# Patient Record
Sex: Female | Born: 1995 | Race: White | Hispanic: No | Marital: Single | Smoking: Never smoker
Health system: Southern US, Community
[De-identification: ages and names within clinical notes are randomized; demographics above are authoritative.]

## PROBLEM LIST (undated history)

## (undated) DIAGNOSIS — G90A Postural orthostatic tachycardia syndrome (POTS): Secondary | ICD-10-CM

## (undated) HISTORY — PX: KNEE SURGERY: SHX244

## (undated) HISTORY — PX: CHOLECYSTECTOMY: SHX55

---
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
## 2022-07-17 ENCOUNTER — Ambulatory Visit (HOSPITAL_COMMUNITY)
Admission: EM | Admit: 2022-07-17 | Discharge: 2022-07-17 | Disposition: A | Discharge status: Home / Self Care | Payer: Medicaid Other | Attending: Family | Admitting: Family

## 2022-07-17 ENCOUNTER — Encounter (HOSPITAL_COMMUNITY): Payer: Self-pay | Admitting: Psychiatry

## 2022-07-17 DIAGNOSIS — F319 Bipolar disorder, unspecified: Secondary | ICD-10-CM | POA: Insufficient documentation

## 2022-07-17 DIAGNOSIS — Z9152 Personal history of nonsuicidal self-harm: Secondary | ICD-10-CM | POA: Insufficient documentation

## 2022-07-17 DIAGNOSIS — Z789 Other specified health status: Secondary | ICD-10-CM | POA: Insufficient documentation

## 2022-07-17 DIAGNOSIS — F209 Schizophrenia, unspecified: Secondary | ICD-10-CM | POA: Insufficient documentation

## 2022-07-17 DIAGNOSIS — Z79899 Other long term (current) drug therapy: Secondary | ICD-10-CM | POA: Insufficient documentation

## 2022-07-17 NOTE — ED Provider Notes (Signed)
Behavioral Health Urgent Care Medical Screening Exam  Patient Name: Carol Simpson MRN: [id number] Date of Evaluation: 07/17/22 Chief Complaint:   Diagnosis:  Final diagnoses:  Difficulty refilling prescriptions    History of Present illness: Carol Simpson is a 27 y.o. female. Patient presents voluntarily to [facility] behavioral health for walk-in assessment.  Patient is accompanied by boyfriend who does not remain present during assessment.  Patient is assessed, face-to-face, by nurse practitioner. She is seated in assessment area, no acute distress. Consulted with provider, Dr. Leverne Humbles, and chart reviewed on 07/17/2022. She  is alert and oriented, pleasant and cooperative during assessment. Patient  presents with euthymic mood, congruent affect.   Carol Simpson seeking outpatient resources for mental health medication management and individual therapy providers today.  Patient reports history of bipolar disorder and schizophrenia.  Patient discharged from outpatient providers office, Dr. Jonny Ruiz, [facility] in [facility], 3 months ago.  Patient reports discharged because her mother, who attended same outpatient practice, was also discharged.  Per patient report both she and her mother were discharged from provider care.  Patient misused quetiapine by taking more than prescribed.  Patient is not linked with outpatient therapy.  Most recently saw counselor 3 years ago.  She would like to follow-up with outpatient counseling moving forward.  She endorses history of 1 previous inpatient psychiatric hospitalization 1 year ago.  She endorses history of nonsuicidal self-harm behavior by cutting from ages 11 to 64.  Most recent cutting episode 10 years ago.  Regarding history of suicide attempts she states "I do not know."  Patient reports she often took Seroquel "until I went to sleep."  Patient reports she would prefer not to be prescribed Seroquel moving forward.  No family mental health history  reported.  Carol Simpson indicates she has been stable on a combination of medications including alprazolam, trazodone, Vraylar and lithium.  Per her report, last prescription for these medications was approximately 3 months ago.  She would like to have these medications refilled.  Per PDMP, alprazolam refilled on 07/12/2022.  Patient reports she does not sleep well without alprazolam and trazodone.  Patient  denies suicidal and homicidal ideations.  Patient easily  contracts verbally for safety with this Probation officer. Patient has normal speech and behavior.  She  denies auditory and visual hallucinations.  Patient is able to converse coherently with goal-directed thoughts and no distractibility or preoccupation.  Denies symptoms of paranoia.  Objectively there is no evidence of psychosis/mania or delusional thinking.  Carol Simpson resides in [place] with her mother and her boyfriend.  She denies access to weapons.  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  Patient endorses average appetite.  She denies alcohol and substance use.  Patient offered support and encouragement.   Patient and family are educated and verbalize understanding of mental health resources and other crisis services in the community. They are instructed to call 911 and present to the nearest emergency room should patient experience any suicidal/homicidal ideation, auditory/visual/hallucinations, or detrimental worsening of mental health condition.      [facility] ED from 07/17/2022 in [place] No Risk       Psychiatric Specialty Exam  Presentation  General Appearance:Appropriate for Environment; Casual  Eye Contact:Good  Speech:Clear and Coherent; Normal Rate  Speech Volume:Normal  Handedness:Right   Mood and Affect  Mood: Euthymic  Affect: Appropriate; Congruent   Thought Process  Thought Processes: Coherent; Goal  Directed;  Linear  Descriptions of Associations:Intact  Orientation:Full (Time, Place and Person)  Thought Content:Logical; WDL    Hallucinations:None  Ideas of Reference:None  Suicidal Thoughts:No  Homicidal Thoughts:No   Sensorium  Memory: Immediate Good; Recent Good  Judgment: Fair  Insight: Fair   Executive Functions  Concentration: Good  Attention Span: Good  Recall: Good  Fund of Knowledge: Fair  Language: Fair   Psychomotor Activity  Psychomotor Activity: Normal   Assets  Assets: Communication Skills; Desire for Improvement; Financial Resources/Insurance; Housing; Leisure Time; Physical Health; Resilience; Social Support   Sleep  Sleep: Poor  Number of hours: No data recorded  Physical Exam: Physical Exam Vitals and nursing note reviewed.  Constitutional:      Appearance: Normal appearance. She is well-developed.  HENT:     Head: Normocephalic and atraumatic.     Nose: Nose normal.  Cardiovascular:     Rate and Rhythm: Normal rate.  Pulmonary:     Effort: Pulmonary effort is normal.  Musculoskeletal:        General: Normal range of motion.     Cervical back: Normal range of motion.  Skin:    General: Skin is warm and dry.  Neurological:     Mental Status: She is alert and oriented to person, place, and time.  Psychiatric:        Attention and Perception: Attention and perception normal.        Mood and Affect: Mood and affect normal.        Speech: Speech normal.        Behavior: Behavior normal. Behavior is cooperative.        Thought Content: Thought content normal.        Cognition and Memory: Cognition normal.    Review of Systems  Constitutional: Negative.   HENT: Negative.    Eyes: Negative.   Respiratory: Negative.    Cardiovascular: Negative.   Gastrointestinal: Negative.   Genitourinary: Negative.   Musculoskeletal: Negative.   Skin: Negative.   Neurological: Negative.   Psychiatric/Behavioral:  The  patient has insomnia.    Blood pressure 127/81, pulse 93, temperature 98.3 F (36.8 C), temperature source Oral, resp. rate 18, SpO2 97 %. There is no height or weight on file to calculate BMI.  Musculoskeletal: Strength & Muscle Tone: within normal limits Gait & Station: normal Patient leans: N/A   [place] M[facility] Discharge Disposition for Follow up and Recommendations: Based on my evaluation the patient does not appear to have an emergency medical condition and can be discharged with resources and follow up care in outpatient services for Medication Management and Individual Therapy Follow up with outpatient psychiatry, resources provided.  Reviewed outpatient providers to a walk-in availabilities including [facility] behavioral health and [place].  Lucky Rathke, FNP 07/17/2022, 11:06 AM

## 2022-07-17 NOTE — ED Triage Notes (Signed)
Pt presents to [facility] voluntarily accompanied by her boyfriend seeking a medication refill. Pt states she has been without her medication for 3 months after being dropped from her current psychiatrist. Pt reports being prescribed Lithium,viibryd, seroquel, Xanax, Vraylar, and trazodone.Pt denies SI/HI and AVH at this time.

## 2022-07-17 NOTE — Discharge Instructions (Addendum)
Patient is instructed prior to discharge to:  Take all medications as prescribed by his/her mental healthcare provider. Report any adverse effects and or reactions from the medicines to his/her outpatient provider promptly. Keep all scheduled appointments, to ensure that you are getting refills on time and to avoid any interruption in your medication.  If you are unable to keep an appointment call to reschedule.  Be sure to follow-up with resources and follow-up appointments provided.  Patient has been instructed & cautioned: To not engage in alcohol and or illegal drug use while on prescription medicines. In the event of worsening symptoms, patient is instructed to call the crisis hotline, 911 and or go to the nearest ED for appropriate evaluation and treatment of symptoms. To follow-up with his/her primary care provider for your other medical issues, concerns and or health care needs.  Information: -National Suicide Prevention Lifeline 1-800-SUICIDE or 934-709-0276.  -988 offers 24/7 access to trained crisis counselors who can help people experiencing mental health-related distress. People can call or text 988 or chat 988lifeline.org for themselves or if they are worried about a loved one who may need crisis support.     Outpatient Services for Therapy and Medication Management for [facility] [place], [place], [address] [phone] phone  New Patient Assessment/Therapy Walk-ins Monday and Wednesday: 8am until slots are full. Every 1st and 2nd Friday: 1pm - 5pm  NO ASSESSMENT/THERAPY WALK-INS ON [place]  New Patient Psychiatry/Medication Management Walk-ins Monday-Friday: 8am-11am  For all walk-ins, we ask that you arrive by 7:30am because patient will be seen in the order of arrival.  Availability is limited; therefore, you may not be seen on the same day that you walk-in.  Our goal is to serve and meet the needs of our  community to the best of our ability.   [facility] [address] [place], [place], [address] [phone] phone  [address] [place], [place], [address] [phone] phone ([address] [place], [place], [place], [place], [employer], [employer], [place], [place], [place], [place], [place], [employer], [employer], [employer], [place])  [facility] [address], [place], [place], [address] [phone] phone  [facility] [address], [place], [place], [address] [phone] phone  [facility] [address], [place], [place], [address] [phone] phone  [facility] [place] [address], [place], [address] [phone] phone  [facility], [place] [address], [place], [address] [phone] phone  [facility] [address] [place], [place], [address] [phone] phone [phone] fax  [facility] [address] [place], [place], [address] [phone] phone [phone] fax  [facility] [phone] [place], [place], [address] [phone] phone  [facility] [address] [place], [place], [address]  [phone] phone  [facility]  (Adults Only) [address] [place], [place], [address]  [phone] phone [phone] fax

## 2022-08-04 ENCOUNTER — Emergency Department (HOSPITAL_BASED_OUTPATIENT_CLINIC_OR_DEPARTMENT_OTHER): Payer: Medicaid Other

## 2022-08-04 ENCOUNTER — Other Ambulatory Visit: Payer: Self-pay

## 2022-08-04 ENCOUNTER — Emergency Department (HOSPITAL_BASED_OUTPATIENT_CLINIC_OR_DEPARTMENT_OTHER)
Admission: EM | Admit: 2022-08-04 | Discharge: 2022-08-04 | Disposition: A | Discharge status: Home / Self Care | Payer: Medicaid Other | Attending: Emergency Medicine | Admitting: Emergency Medicine

## 2022-08-04 ENCOUNTER — Encounter (HOSPITAL_BASED_OUTPATIENT_CLINIC_OR_DEPARTMENT_OTHER): Payer: Self-pay | Admitting: Emergency Medicine

## 2022-08-04 DIAGNOSIS — R1011 Right upper quadrant pain: Secondary | ICD-10-CM | POA: Diagnosis not present

## 2022-08-04 DIAGNOSIS — Z1152 Encounter for screening for COVID-19: Secondary | ICD-10-CM | POA: Diagnosis not present

## 2022-08-04 DIAGNOSIS — J069 Acute upper respiratory infection, unspecified: Secondary | ICD-10-CM | POA: Diagnosis not present

## 2022-08-04 DIAGNOSIS — R1010 Upper abdominal pain, unspecified: Secondary | ICD-10-CM

## 2022-08-04 DIAGNOSIS — R112 Nausea with vomiting, unspecified: Secondary | ICD-10-CM | POA: Diagnosis present

## 2022-08-04 HISTORY — DX: Postural orthostatic tachycardia syndrome (POTS): G90.A

## 2022-08-04 LAB — CBC
HCT: 42 % (ref 36.0–46.0)
Hemoglobin: 14.1 g/dL (ref 12.0–15.0)
MCH: 32.4 pg (ref 26.0–34.0)
MCHC: 33.6 g/dL (ref 30.0–36.0)
MCV: 96.6 fL (ref 80.0–100.0)
Platelets: 371 10*3/uL (ref 150–400)
RBC: 4.35 MIL/uL (ref 3.87–5.11)
RDW: 12.8 % (ref 11.5–15.5)
WBC: 11.5 10*3/uL — ABNORMAL HIGH (ref 4.0–10.5)
nRBC: 0 % (ref 0.0–0.2)

## 2022-08-04 LAB — COMPREHENSIVE METABOLIC PANEL
ALT: 33 U/L (ref 0–44)
AST: 28 U/L (ref 15–41)
Albumin: 4.6 g/dL (ref 3.5–5.0)
Alkaline Phosphatase: 83 U/L (ref 38–126)
Anion gap: 10 (ref 5–15)
BUN: 6 mg/dL (ref 6–20)
CO2: 17 mmol/L — ABNORMAL LOW (ref 22–32)
Calcium: 9.6 mg/dL (ref 8.9–10.3)
Chloride: 107 mmol/L (ref 98–111)
Creatinine, Ser: 0.97 mg/dL (ref 0.44–1.00)
GFR, Estimated: >60 mL/min (ref >60)
Glucose, Bld: 104 mg/dL — ABNORMAL HIGH (ref 70–99)
Potassium: 4 mmol/L (ref 3.5–5.1)
Sodium: 134 mmol/L — ABNORMAL LOW (ref 135–145)
Total Bilirubin: 0.7 mg/dL (ref 0.3–1.2)
Total Protein: 7.8 g/dL (ref 6.5–8.1)

## 2022-08-04 LAB — RESP PANEL BY RT-PCR (RSV, FLU A&B, COVID)  RVPGX2
Influenza A by PCR: NEGATIVE
Influenza B by PCR: NEGATIVE
Resp Syncytial Virus by PCR: NEGATIVE
SARS Coronavirus 2 by RT PCR: NEGATIVE

## 2022-08-04 LAB — URINALYSIS, ROUTINE W REFLEX MICROSCOPIC
Bilirubin Urine: NEGATIVE
Glucose, UA: NEGATIVE mg/dL
Ketones, ur: NEGATIVE mg/dL
Nitrite: NEGATIVE
Protein, ur: 30 mg/dL — AB
Specific Gravity, Urine: 1.025 (ref 1.005–1.030)
pH: 6 (ref 5.0–8.0)

## 2022-08-04 LAB — URINALYSIS, MICROSCOPIC (REFLEX)

## 2022-08-04 LAB — LIPASE, BLOOD: Lipase: 37 U/L (ref 11–51)

## 2022-08-04 LAB — PREGNANCY, URINE: Preg Test, Ur: NEGATIVE

## 2022-08-04 MED ORDER — FLUCONAZOLE 150 MG PO TABS
150.0000 mg | ORAL_TABLET | Freq: Once | ORAL | Status: AC
  Administered 2022-08-04: 150 mg via ORAL
  Filled 2022-08-04: qty 1

## 2022-08-04 MED ORDER — SODIUM CHLORIDE 0.9 % IV BOLUS
1000.0000 mL | Freq: Once | INTRAVENOUS | Status: AC
  Administered 2022-08-04: 1000 mL via INTRAVENOUS

## 2022-08-04 MED ORDER — ALPRAZOLAM 0.5 MG PO TABS
0.5000 mg | ORAL_TABLET | Freq: Once | ORAL | Status: AC
  Administered 2022-08-04: 0.5 mg via ORAL
  Filled 2022-08-04: qty 1

## 2022-08-04 MED ORDER — IOHEXOL 300 MG/ML  SOLN
100.0000 mL | Freq: Once | INTRAMUSCULAR | Status: AC | PRN
  Administered 2022-08-04: 100 mL via INTRAVENOUS

## 2022-08-04 MED ORDER — ONDANSETRON HCL 4 MG/2ML IJ SOLN
4.0000 mg | Freq: Once | INTRAMUSCULAR | Status: AC
  Administered 2022-08-04: 4 mg via INTRAVENOUS
  Filled 2022-08-04: qty 2

## 2022-08-04 NOTE — ED Notes (Signed)
Lab notified to add-on urine culture to previously collected urine sample.  

## 2022-08-04 NOTE — ED Notes (Signed)
Pt in imaging

## 2022-08-04 NOTE — ED Notes (Signed)
D/c paperwork reviewed with pt, including follow up care.  No questions or concerns voiced at time of d/c. . Pt verbalized understanding, Ambulatory with family to ED exit, NAD.   

## 2022-08-04 NOTE — ED Notes (Signed)
ED Provider at bedside. 

## 2022-08-04 NOTE — ED Provider Notes (Signed)
[facility] Provider Note   CSN: [id number] Arrival date & time: 08/04/22  1809     History  Chief Complaint  Patient presents with   Abdominal Pain    Coral Rais is a 27 y.o. female.  Patient is a 27 year old who presents with multiple complaints.  She has a history of POTS.  She states she has been feeling bad for about 2 weeks.  She has had diarrhea associated with nausea and vomiting.  She says she has had some intermittent fevers but has not checked her temperature.  She has abdominal pain which is mostly upper.  She had runny nose congestion and some coughing.  No shortness of breath.  Her emesis is nonbloody nonbilious.  She has watery diarrhea.  She said her PCP was concerned about her gallbladder and if her symptoms were improving she needs to come to the ED.  She was recently treated for UTI during the symptoms.  She says she just completed a course of antibiotics yesterday.  She said she does not have any urinary symptoms such as burning on urination or urinary frequency.       Home Medications Prior to Admission medications   Not on File      Allergies    Patient has no known allergies.    Review of Systems   Review of Systems  Constitutional:  Positive for fatigue and fever. Negative for chills and diaphoresis.  HENT:  Positive for congestion and rhinorrhea. Negative for sneezing.   Eyes: Negative.   Respiratory:  Positive for cough. Negative for chest tightness and shortness of breath.   Cardiovascular:  Negative for chest pain and leg swelling.  Gastrointestinal:  Positive for abdominal pain, diarrhea, nausea and vomiting. Negative for blood in stool.  Genitourinary:  Negative for difficulty urinating, flank pain, frequency and hematuria.  Musculoskeletal:  Negative for arthralgias and back pain.  Skin:  Negative for rash.  Neurological:  Negative for dizziness, speech difficulty, weakness, numbness and  headaches.    Physical Exam Updated Vital Signs BP (!) 114/46   Pulse 93   Temp 98.8 F (37.1 C)   Resp 15   Ht '5\' 3"'$  (1.6 m)   Wt 96.6 kg   LMP 07/28/2022   SpO2 100%   BMI 37.73 kg/m  Physical Exam Constitutional:      Appearance: She is well-developed.  HENT:     Head: Normocephalic and atraumatic.  Eyes:     Pupils: Pupils are equal, round, and reactive to light.  Cardiovascular:     Rate and Rhythm: Normal rate and regular rhythm.     Heart sounds: Normal heart sounds.  Pulmonary:     Effort: Pulmonary effort is normal. No respiratory distress.     Breath sounds: Normal breath sounds. No wheezing or rales.  Chest:     Chest wall: No tenderness.  Abdominal:     General: Bowel sounds are normal.     Palpations: Abdomen is soft.     Tenderness: There is abdominal tenderness in the right upper quadrant, epigastric area and left upper quadrant. There is no guarding or rebound.  Musculoskeletal:        General: Normal range of motion.     Cervical back: Normal range of motion and neck supple.  Lymphadenopathy:     Cervical: No cervical adenopathy.  Skin:    General: Skin is warm and dry.     Findings: No rash.  Neurological:  Mental Status: She is alert and oriented to person, place, and time.     ED Results / Procedures / Treatments   Labs (all labs ordered are listed, but only abnormal results are displayed) Labs Reviewed  COMPREHENSIVE METABOLIC PANEL - Abnormal; Notable for the following components:      Result Value   Sodium 134 (*)    CO2 17 (*)    Glucose, Bld 104 (*)    All other components within normal limits  CBC - Abnormal; Notable for the following components:   WBC 11.5 (*)    All other components within normal limits  URINALYSIS, ROUTINE W REFLEX MICROSCOPIC - Abnormal; Notable for the following components:   APPearance CLOUDY (*)    Hgb urine dipstick TRACE (*)    Protein, ur 30 (*)    Leukocytes,Ua LARGE (*)    All other components  within normal limits  URINALYSIS, MICROSCOPIC (REFLEX) - Abnormal; Notable for the following components:   Bacteria, UA FEW (*)    All other components within normal limits  RESP PANEL BY RT-PCR (RSV, FLU A&B, COVID)  RVPGX2  URINE CULTURE  LIPASE, BLOOD  PREGNANCY, URINE    EKG None  Radiology CT Abdomen Pelvis W Contrast  Result Date: 08/04/2022 CLINICAL DATA:  Abdominal pain, acute, nonlocalized EXAM: CT ABDOMEN AND PELVIS WITH CONTRAST TECHNIQUE: Multidetector CT imaging of the abdomen and pelvis was performed using the standard protocol following bolus administration of intravenous contrast. RADIATION DOSE REDUCTION: This exam was performed according to the departmental dose-optimization program which includes automated exposure control, adjustment of the mA and/or kV according to patient size and/or use of iterative reconstruction technique. CONTRAST:  145m OMNIPAQUE IOHEXOL 300 MG/ML  SOLN COMPARISON:  CT abdomen pelvis 08/14/2012 report without imaging FINDINGS: Lower chest: No acute abnormality. Hepatobiliary: No focal liver abnormality. No gallstones, gallbladder wall thickening, or pericholecystic fluid. No biliary dilatation. Pancreas: No focal lesion. Normal pancreatic contour. No surrounding inflammatory changes. No main pancreatic ductal dilatation. Spleen: Normal in size without focal abnormality. Adrenals/Urinary Tract: No adrenal nodule bilaterally. Bilateral kidneys enhance symmetrically. No hydronephrosis. No hydroureter. The urinary bladder is unremarkable. Stomach/Bowel: Stomach is within normal limits. No evidence of bowel wall thickening or dilatation. Appendix appears normal. Vascular/Lymphatic: No abdominal aorta or iliac aneurysm. No abdominal, pelvic, or inguinal lymphadenopathy. Reproductive: Uterus and bilateral adnexa are unremarkable. Other: No intraperitoneal free fluid. No intraperitoneal free gas. No organized fluid collection. Musculoskeletal: No abdominal wall  hernia or abnormality. No suspicious lytic or blastic osseous lesions. No acute displaced fracture. IMPRESSION: No acute intra-abdominal or intrapelvic abnormality. Electronically Signed   By: MIven FinnM.D.   On: 08/04/2022 20:32   DG Chest 2 View  Result Date: 08/04/2022 CLINICAL DATA:  Cough upper abdominal pain EXAM: CHEST - 2 VIEW COMPARISON:  None Available. FINDINGS: The heart size and mediastinal contours are within normal limits. Both lungs are clear. The visualized skeletal structures are unremarkable. IMPRESSION: No active cardiopulmonary disease. Electronically Signed   By: KDonavan FoilM.D.   On: 08/04/2022 20:28    Procedures Procedures    Medications Ordered in ED Medications  fluconazole (DIFLUCAN) tablet 150 mg (has no administration in time range)  sodium chloride 0.9 % bolus 1,000 mL (1,000 mLs Intravenous Bolus 08/04/22 1934)  ondansetron (ZOFRAN) injection 4 mg (4 mg Intravenous Given 08/04/22 1934)  iohexol (OMNIPAQUE) 300 MG/ML solution 100 mL (100 mLs Intravenous Contrast Given 08/04/22 2005)  ALPRAZolam (XANAX) tablet 0.5 mg (0.5 mg Oral Given  08/04/22 2057)    ED Course/ Medical Decision Making/ A&P                             Medical Decision Making Amount and/or Complexity of Data Reviewed Labs: ordered. Radiology: ordered.  Risk Prescription drug management.   Patient presents with abdominal pain associate with diarrhea.  This been going on about 2 weeks.  Mostly across her upper abdomen.  She is also had some URI symptoms.  Her COVID/flu test is negative.  Her labs are nonconcerning.  Her pregnancy test is negative.  CT scan of her abdomen pelvis showed a normal gallbladder.  No other acute abnormality.  Her LFTs are normal.  No suggestions of pancreatitis.  Her urine shows some suggestions of infection but it appears to be a dirty specimen with a lot of squamous cells.  She just finished a course of antibiotics for a UTI and currently is not  symptomatic.  Will opt for no treatment at this point.  She was given IV fluids and antiemetics.  She also was having a lot of anxiety and was given a dose of her Xanax.  She feels much better after this.  Her repeat abdominal exam is benign.  She is smiling and happy and sitting up in the bed.  No indication for hospitalization.  She does ask for a dose of Diflucan given that she feels like she got a yeast infection from the antibiotics.  Will give her this.  She has a follow-up appointment with her PCP in 2 days.  Return precautions were given.  Final Clinical Impression(s) / ED Diagnoses Final diagnoses:  Pain of upper abdomen  Viral upper respiratory tract infection    Rx / DC Orders ED Discharge Orders     None         Malvin Johns, MD 08/04/22 2224

## 2022-08-04 NOTE — ED Triage Notes (Signed)
Pt reports mid upper abd pain x 2 wks, worse now; also reports NVD

## 2022-08-06 LAB — URINE CULTURE: Culture: 20000 — AB

## 2022-08-07 ENCOUNTER — Telehealth (HOSPITAL_BASED_OUTPATIENT_CLINIC_OR_DEPARTMENT_OTHER): Payer: Self-pay | Admitting: *Deleted

## 2022-08-07 NOTE — Progress Notes (Signed)
ED Antimicrobial Stewardship Positive Culture Follow Up   Carol Simpson is an 27 y.o. female who presented to [facility] on 08/04/2022 with a chief complaint of  Chief Complaint  Patient presents with   Abdominal Pain    Recent Results (from the past 720 hour(s))  Resp panel by RT-PCR (RSV, Flu A&B, Covid) Anterior Nasal Swab     Status: None   Collection Time: 08/04/22  7:17 PM   Specimen: Anterior Nasal Swab  Result Value Ref Range Status   SARS Coronavirus 2 by RT PCR NEGATIVE NEGATIVE Final    Comment: (NOTE) SARS-CoV-2 target nucleic acids are NOT DETECTED.  The SARS-CoV-2 RNA is generally detectable in upper respiratory specimens during the acute phase of infection. The lowest concentration of SARS-CoV-2 viral copies this assay can detect is 138 copies/mL. A negative result does not preclude SARS-Cov-2 infection and should not be used as the sole basis for treatment or other patient management decisions. A negative result may occur with  improper specimen collection/handling, submission of specimen other than nasopharyngeal swab, presence of viral mutation(s) within the areas targeted by this assay, and inadequate number of viral copies(<138 copies/mL). A negative result must be combined with clinical observations, patient history, and epidemiological information. The expected result is Negative.  Fact Sheet for Patients:  EntrepreneurPulse.com.au  Fact Sheet for Healthcare Providers:  IncredibleEmployment.be  This test is no t yet approved or cleared by the [place] FDA and  has been authorized for detection and/or diagnosis of SARS-CoV-2 by FDA under an Emergency Use Authorization (EUA). This EUA will remain  in effect (meaning this test can be used) for the duration of the COVID-19 declaration under Section 564(b)(1) of the Act, 21 U.S.C.section 360bbb-3(b)(1), unless the authorization is terminated  or revoked sooner.        Influenza A by PCR NEGATIVE NEGATIVE Final   Influenza B by PCR NEGATIVE NEGATIVE Final    Comment: (NOTE) The Xpert Xpress SARS-CoV-2/FLU/RSV plus assay is intended as an aid in the diagnosis of influenza from Nasopharyngeal swab specimens and should not be used as a sole basis for treatment. Nasal washings and aspirates are unacceptable for Xpert Xpress SARS-CoV-2/FLU/RSV testing.  Fact Sheet for Patients: EntrepreneurPulse.com.au  Fact Sheet for Healthcare Providers: IncredibleEmployment.be  This test is not yet approved or cleared by the [place] FDA and has been authorized for detection and/or diagnosis of SARS-CoV-2 by FDA under an Emergency Use Authorization (EUA). This EUA will remain in effect (meaning this test can be used) for the duration of the COVID-19 declaration under Section 564(b)(1) of the Act, 21 U.S.C. section 360bbb-3(b)(1), unless the authorization is terminated or revoked.     Resp Syncytial Virus by PCR NEGATIVE NEGATIVE Final    Comment: (NOTE) Fact Sheet for Patients: EntrepreneurPulse.com.au  Fact Sheet for Healthcare Providers: IncredibleEmployment.be  This test is not yet approved or cleared by the [place] FDA and has been authorized for detection and/or diagnosis of SARS-CoV-2 by FDA under an Emergency Use Authorization (EUA). This EUA will remain in effect (meaning this test can be used) for the duration of the COVID-19 declaration under Section 564(b)(1) of the Act, 21 U.S.C. section 360bbb-3(b)(1), unless the authorization is terminated or revoked.  Performed at [facility], [address], [place], [place] [address]   Urine Culture     Status: Abnormal   Collection Time: 08/04/22  7:38 PM   Specimen: Urine, Clean Catch  Result Value Ref Range Status  Specimen Description   Final    URINE, CLEAN CATCH Performed at [facility],  [address] [place], [place] [address]    Special Requests   Final    NONE Performed at [facility], [address] [place], [place] [address]    Culture (A)  Final    20,000 COLONIES/mL ESCHERICHIA COLI 20,000 COLONIES/mL STAPHYLOCOCCUS EPIDERMIDIS CALL MICROBIOLOGY LAB IF SENSITIVITIES ARE REQUIRED. Performed at [facility], [place] [address], [place], [place] [address]    Report Status 08/06/2022 FINAL  Final   Organism ID, Bacteria ESCHERICHIA COLI (A)  Final      Susceptibility   Escherichia coli - MIC*    AMPICILLIN >=32 RESISTANT Resistant     CEFAZOLIN <=4 SENSITIVE Sensitive     CEFEPIME <=0.12 SENSITIVE Sensitive     CEFTRIAXONE <=0.25 SENSITIVE Sensitive     CIPROFLOXACIN 2 RESISTANT Resistant     GENTAMICIN <=1 SENSITIVE Sensitive     IMIPENEM <=0.25 SENSITIVE Sensitive     NITROFURANTOIN 64 INTERMEDIATE Intermediate     TRIMETH/SULFA <=20 SENSITIVE Sensitive     AMPICILLIN/SULBACTAM >=32 RESISTANT Resistant     PIP/TAZO <=4 SENSITIVE Sensitive     * 20,000 COLONIES/mL ESCHERICHIA COLI    [x]  Patient without urinary symptoms but recently completed course of Augmentin for reportedly a UTI. Above E. Coli shows likely resistant. However, above culture is concerning for a contaminated culture and with low CFUs. Recommend patient follow-up with PCP to see if repeat urine studies are indicated.  ED Provider: Raymon Mutton, PharmD, BCPS 08/07/2022 10:05 AM ED Clinical Pharmacist -  [phone]

## 2022-08-07 NOTE — Telephone Encounter (Signed)
Post ED Visit - Positive Culture Follow-up  Culture report reviewed by antimicrobial stewardship pharmacist: [place] Team []  Elenor Quinones, Pharm.D. []  Heide Guile, Pharm.D., BCPS AQ-ID []  Parks Neptune, Pharm.D., BCPS []  Alycia Rossetti, Pharm.D., BCPS []  [place], Pharm.D., BCPS, AAHIVP []  Legrand Como, Pharm.D., BCPS, AAHIVP []  Salome Arnt, PharmD, BCPS []  Johnnette Gourd, PharmD, BCPS []  Hughes Better, PharmD, BCPS []  Leeroy Cha, PharmD []  Laqueta Linden, PharmD, BCPS [x]  Lorelei Pont, PharmD  [place] Team []  Leodis Sias, PharmD []  Lindell Spar, PharmD []  Royetta Asal, PharmD []  Graylin Shiver, Rph []  Rema Fendt) Glennon Mac, PharmD []  Arlyn Dunning, PharmD []  Netta Cedars, PharmD []  Dia Sitter, PharmD []  Leone Haven, PharmD []  Gretta Arab, PharmD []  Theodis Shove, PharmD []  Peggyann Juba, PharmD []  Reuel Boom, PharmD   Positive urine culture  Culture concerning for contaminated culture. Recommended patient follow up with PCP if concerned for UTI  Rosie Fate 08/07/2022, 1:11 PM

## 2022-08-27 ENCOUNTER — Other Ambulatory Visit: Payer: Self-pay

## 2022-08-27 ENCOUNTER — Emergency Department (HOSPITAL_BASED_OUTPATIENT_CLINIC_OR_DEPARTMENT_OTHER): Payer: Medicaid Other

## 2022-08-27 ENCOUNTER — Encounter (HOSPITAL_BASED_OUTPATIENT_CLINIC_OR_DEPARTMENT_OTHER): Payer: Self-pay | Admitting: Emergency Medicine

## 2022-08-27 ENCOUNTER — Emergency Department (HOSPITAL_BASED_OUTPATIENT_CLINIC_OR_DEPARTMENT_OTHER)
Admission: EM | Admit: 2022-08-27 | Discharge: 2022-08-28 | Disposition: A | Discharge status: Home / Self Care | Payer: Medicaid Other | Attending: Emergency Medicine | Admitting: Emergency Medicine

## 2022-08-27 DIAGNOSIS — K529 Noninfective gastroenteritis and colitis, unspecified: Secondary | ICD-10-CM | POA: Diagnosis not present

## 2022-08-27 DIAGNOSIS — R109 Unspecified abdominal pain: Secondary | ICD-10-CM | POA: Diagnosis present

## 2022-08-27 DIAGNOSIS — R1013 Epigastric pain: Secondary | ICD-10-CM

## 2022-08-27 LAB — URINALYSIS, MICROSCOPIC (REFLEX)

## 2022-08-27 LAB — URINALYSIS, ROUTINE W REFLEX MICROSCOPIC
Bilirubin Urine: NEGATIVE
Glucose, UA: NEGATIVE mg/dL
Ketones, ur: NEGATIVE mg/dL
Leukocytes,Ua: NEGATIVE
Nitrite: NEGATIVE
Protein, ur: 30 mg/dL — AB
Specific Gravity, Urine: >=1.03 (ref 1.005–1.030)
pH: 6 (ref 5.0–8.0)

## 2022-08-27 LAB — COMPREHENSIVE METABOLIC PANEL
ALT: 66 U/L — ABNORMAL HIGH (ref 0–44)
AST: 23 U/L (ref 15–41)
Albumin: 4.7 g/dL (ref 3.5–5.0)
Alkaline Phosphatase: 68 U/L (ref 38–126)
Anion gap: 11 (ref 5–15)
BUN: 9 mg/dL (ref 6–20)
CO2: 22 mmol/L (ref 22–32)
Calcium: 9.5 mg/dL (ref 8.9–10.3)
Chloride: 104 mmol/L (ref 98–111)
Creatinine, Ser: 1.11 mg/dL — ABNORMAL HIGH (ref 0.44–1.00)
GFR, Estimated: >60 mL/min (ref >60)
Glucose, Bld: 112 mg/dL — ABNORMAL HIGH (ref 70–99)
Potassium: 3.1 mmol/L — ABNORMAL LOW (ref 3.5–5.1)
Sodium: 137 mmol/L (ref 135–145)
Total Bilirubin: 0.3 mg/dL (ref 0.3–1.2)
Total Protein: 8 g/dL (ref 6.5–8.1)

## 2022-08-27 LAB — CBC
HCT: 37.3 % (ref 36.0–46.0)
Hemoglobin: 12.3 g/dL (ref 12.0–15.0)
MCH: 32.2 pg (ref 26.0–34.0)
MCHC: 33 g/dL (ref 30.0–36.0)
MCV: 97.6 fL (ref 80.0–100.0)
Platelets: 409 10*3/uL — ABNORMAL HIGH (ref 150–400)
RBC: 3.82 MIL/uL — ABNORMAL LOW (ref 3.87–5.11)
RDW: 13 % (ref 11.5–15.5)
WBC: 9.7 10*3/uL (ref 4.0–10.5)
nRBC: 0 % (ref 0.0–0.2)

## 2022-08-27 LAB — PREGNANCY, URINE: Preg Test, Ur: NEGATIVE

## 2022-08-27 LAB — LIPASE, BLOOD: Lipase: 29 U/L (ref 11–51)

## 2022-08-27 MED ORDER — FLUOXETINE HCL 10 MG PO CAPS
20.0000 mg | ORAL_CAPSULE | Freq: Every day | ORAL | Status: DC
  Administered 2022-08-27: 20 mg via ORAL
  Filled 2022-08-27: qty 2

## 2022-08-27 MED ORDER — FENTANYL CITRATE PF 50 MCG/ML IJ SOSY
50.0000 ug | PREFILLED_SYRINGE | Freq: Once | INTRAMUSCULAR | Status: AC
  Administered 2022-08-27: 50 ug via INTRAVENOUS
  Filled 2022-08-27: qty 1

## 2022-08-27 MED ORDER — OXYCODONE-ACETAMINOPHEN 5-325 MG PO TABS: 1.0000 | ORAL_TABLET | Freq: Four times a day (QID) | ORAL | 0 refills | Status: DC | PRN

## 2022-08-27 MED ORDER — SODIUM CHLORIDE 0.9 % IV BOLUS
1000.0000 mL | Freq: Once | INTRAVENOUS | Status: AC
  Administered 2022-08-27: 1000 mL via INTRAVENOUS

## 2022-08-27 MED ORDER — ONDANSETRON HCL 4 MG/2ML IJ SOLN
4.0000 mg | Freq: Once | INTRAMUSCULAR | Status: AC
  Administered 2022-08-27: 4 mg via INTRAVENOUS
  Filled 2022-08-27: qty 2

## 2022-08-27 MED ORDER — IOHEXOL 300 MG/ML  SOLN
100.0000 mL | Freq: Once | INTRAMUSCULAR | Status: AC | PRN
  Administered 2022-08-27: 100 mL via INTRAVENOUS

## 2022-08-27 MED ORDER — KETOROLAC TROMETHAMINE 15 MG/ML IJ SOLN
15.0000 mg | Freq: Once | INTRAMUSCULAR | Status: AC
  Administered 2022-08-27: 15 mg via INTRAVENOUS
  Filled 2022-08-27: qty 1

## 2022-08-27 MED ORDER — ONDANSETRON 8 MG PO TBDP: 8.0000 mg | ORAL_TABLET | Freq: Three times a day (TID) | ORAL | 0 refills | Status: DC | PRN

## 2022-08-27 NOTE — ED Provider Notes (Signed)
[place] [facility] Provider Note   CSN: [id number] Arrival date & time: 08/27/22  2016     History  Chief Complaint  Patient presents with   Abdominal Pain    Carol Simpson is a 27 y.o. female.   Abdominal Pain    Patient with medical history of POTS, 3 previous C-sections, recent cholecystectomy 08/17/22 presents to the emergency department due to abdominal pain.  Is been going on for the last 5 days after the surgery, associated with nausea and vomiting.  She is also having diarrhea.  No bloody or dark and tarry stools.  She denies any chest pain or shortness of breath, no syncope.  No recent changes in medication.    Home Medications Prior to Admission medications   Not on File      Allergies    Patient has no known allergies.    Review of Systems   Review of Systems  Gastrointestinal:  Positive for abdominal pain.    Physical Exam Updated Vital Signs BP 125/74 (BP Location: Right Arm)   Pulse 90   Temp 98.5 F (36.9 C) (Oral)   Resp 18   LMP 07/28/2022   SpO2 97%  Physical Exam Vitals and nursing note reviewed. Exam conducted with a chaperone present.  Constitutional:      Appearance: Normal appearance.  HENT:     Head: Normocephalic and atraumatic.  Eyes:     General: No scleral icterus.       Right eye: No discharge.        Left eye: No discharge.     Extraocular Movements: Extraocular movements intact.     Pupils: Pupils are equal, round, and reactive to light.  Cardiovascular:     Rate and Rhythm: Normal rate and regular rhythm.     Pulses: Normal pulses.     Heart sounds: Normal heart sounds. No murmur heard.    No friction rub. No gallop.  Pulmonary:     Effort: Pulmonary effort is normal. No respiratory distress.     Breath sounds: Normal breath sounds.  Abdominal:     General: Abdomen is flat. A surgical scar is present. Bowel sounds are normal. There is no distension.     Palpations: Abdomen is soft.      Tenderness: There is abdominal tenderness in the periumbilical area.     Comments: Surgical scars are well-healing, no surrounding erythema or purulence with palpation  Skin:    General: Skin is warm and dry.     Coloration: Skin is not jaundiced.  Neurological:     Mental Status: She is alert. Mental status is at baseline.     Coordination: Coordination normal.     ED Results / Procedures / Treatments   Labs (all labs ordered are listed, but only abnormal results are displayed) Labs Reviewed  COMPREHENSIVE METABOLIC PANEL - Abnormal; Notable for the following components:      Result Value   Potassium 3.1 (*)    Glucose, Bld 112 (*)    Creatinine, Ser 1.11 (*)    ALT 66 (*)    All other components within normal limits  CBC - Abnormal; Notable for the following components:   RBC 3.82 (*)    Platelets 409 (*)    All other components within normal limits  URINALYSIS, ROUTINE W REFLEX MICROSCOPIC - Abnormal; Notable for the following components:   APPearance CLOUDY (*)    Hgb urine dipstick SMALL (*)  Protein, ur 30 (*)    All other components within normal limits  URINALYSIS, MICROSCOPIC (REFLEX) - Abnormal; Notable for the following components:   Bacteria, UA RARE (*)    All other components within normal limits  LIPASE, BLOOD  PREGNANCY, URINE    EKG None  Radiology No results found.  Procedures Procedures    Medications Ordered in ED Medications  sodium chloride 0.9 % bolus 1,000 mL (has no administration in time range)  ondansetron (ZOFRAN) injection 4 mg (has no administration in time range)  fentaNYL (SUBLIMAZE) injection 50 mcg (has no administration in time range)    ED Course/ Medical Decision Making/ A&P                             Medical Decision Making Amount and/or Complexity of Data Reviewed Labs: ordered. Radiology: ordered.  Risk Prescription drug management.   This is a 27 year old female presenting to the emergency department  due to abdominal pain.  Differential includes postop complication, dehydration, electrolyte derangement, AKI, metabolic abnormality, diabetes, UTI, ectopic pregnancy.  Review external medical record, patient does have a laparoscopic cholecystectomy 08/17/2022.  Start with laboratory workup and CT abdomen to assess for postop complication.  Will also treat pain with fentanyl, will initiate fluids and Zofran as an antiemetic and then reevaluate.  Laboratory workup is reassuring, no leukocytosis or anemia.  No gross electrolyte derangement although patient has a very mild AKI with a creatinine 1.11.  Lipase within normal synapses pancreatitis.  Patient is not pregnant.  UA is negative for indications of UTI.  There is some slight proteinuria consistent with emesis at home.  CT scan is negative for any postop complication.  There is some mild enteritis on the scan.  I reevaluate the patient, pain is somewhat improved but not fully resolved.  Will provide Toradol.  I discussed the results with the patient, will prescribe 2 days of short-term pain medicine until she is able to follow-up with her general surgeon given continued pain.        Final Clinical Impression(s) / ED Diagnoses Final diagnoses:  None    Rx / DC Orders ED Discharge Orders     None         Theron AristaSage, Aida Lemaire, Cordelia Poche-C 08/28/22 2204    Lonell GrandchildScheving, William L, MD 09/03/22 [phone]

## 2022-08-27 NOTE — Discharge Instructions (Signed)
You are seen to the emergency department due to abdominal pain.  Reassuringly there are no signs of postop complication.  There was some enteritis on the CT scan which is typically inflammation in the colon usually due to a virus.  Drink plenty of fluids, I sent 2 days worth of Percocet, take every 6 hours as needed for severe pain.  Try to rely on Tylenol and Motrin for pain.  Follow-up with your general surgeon, call on Monday to schedule follow-up.  Return to the ED for new or emergent conditions.

## 2022-08-27 NOTE — ED Triage Notes (Signed)
Pt here for upper ad cramping w/ N/V/D. Pt had cholecystectomy last Tuesday, 3/26, pt started having pain last Friday. Pt reports cold sweats, has follow up with surgeon on 4/16.

## 2022-08-28 ENCOUNTER — Telehealth (HOSPITAL_BASED_OUTPATIENT_CLINIC_OR_DEPARTMENT_OTHER): Payer: Self-pay | Admitting: Emergency Medicine

## 2022-08-28 MED ORDER — OXYCODONE-ACETAMINOPHEN 5-325 MG PO TABS: 1.0000 | ORAL_TABLET | Freq: Four times a day (QID) | ORAL | 0 refills | Status: AC | PRN

## 2022-08-28 MED ORDER — ONDANSETRON 8 MG PO TBDP: 8.0000 mg | ORAL_TABLET | Freq: Three times a day (TID) | ORAL | 0 refills | Status: AC | PRN

## 2022-08-28 NOTE — Telephone Encounter (Signed)
Patient is requesting her prescriptions be sent to a different pharmacy.  I called [facility] that it was sent to and their system was down for several hours and they never received the prescription.  Will send it to her preferred pharmacy at [facility] in [place] on [address].

## 2022-08-30 ENCOUNTER — Emergency Department (HOSPITAL_BASED_OUTPATIENT_CLINIC_OR_DEPARTMENT_OTHER)
Admission: EM | Admit: 2022-08-30 | Discharge: 2022-08-30 | Disposition: A | Discharge status: Home / Self Care | Payer: Medicaid Other | Attending: Emergency Medicine | Admitting: Emergency Medicine

## 2022-08-30 ENCOUNTER — Encounter (HOSPITAL_BASED_OUTPATIENT_CLINIC_OR_DEPARTMENT_OTHER): Payer: Self-pay | Admitting: Urology

## 2022-08-30 DIAGNOSIS — E876 Hypokalemia: Secondary | ICD-10-CM

## 2022-08-30 DIAGNOSIS — R1011 Right upper quadrant pain: Secondary | ICD-10-CM | POA: Diagnosis present

## 2022-08-30 DIAGNOSIS — R1013 Epigastric pain: Secondary | ICD-10-CM

## 2022-08-30 DIAGNOSIS — N3000 Acute cystitis without hematuria: Secondary | ICD-10-CM

## 2022-08-30 LAB — URINALYSIS, ROUTINE W REFLEX MICROSCOPIC
Bilirubin Urine: NEGATIVE
Glucose, UA: NEGATIVE mg/dL
Ketones, ur: NEGATIVE mg/dL
Nitrite: POSITIVE — AB
Protein, ur: NEGATIVE mg/dL
Specific Gravity, Urine: 1.015 (ref 1.005–1.030)
pH: 5.5 (ref 5.0–8.0)

## 2022-08-30 LAB — CBC WITH DIFFERENTIAL/PLATELET
Abs Immature Granulocytes: 0.03 10*3/uL (ref 0.00–0.07)
Basophils Absolute: 0 10*3/uL (ref 0.0–0.1)
Basophils Relative: 0 %
Eosinophils Absolute: 0.1 10*3/uL (ref 0.0–0.5)
Eosinophils Relative: 1 %
HCT: 39.6 % (ref 36.0–46.0)
Hemoglobin: 13.2 g/dL (ref 12.0–15.0)
Immature Granulocytes: 0 %
Lymphocytes Relative: 23 %
Lymphs Abs: 2.2 10*3/uL (ref 0.7–4.0)
MCH: 32.4 pg (ref 26.0–34.0)
MCHC: 33.3 g/dL (ref 30.0–36.0)
MCV: 97.3 fL (ref 80.0–100.0)
Monocytes Absolute: 0.5 10*3/uL (ref 0.1–1.0)
Monocytes Relative: 5 %
Neutro Abs: 6.8 10*3/uL (ref 1.7–7.7)
Neutrophils Relative %: 71 %
Platelets: 495 10*3/uL — ABNORMAL HIGH (ref 150–400)
RBC: 4.07 MIL/uL (ref 3.87–5.11)
RDW: 13 % (ref 11.5–15.5)
WBC: 9.6 10*3/uL (ref 4.0–10.5)
nRBC: 0 % (ref 0.0–0.2)

## 2022-08-30 LAB — COMPREHENSIVE METABOLIC PANEL
ALT: 40 U/L (ref 0–44)
AST: 26 U/L (ref 15–41)
Albumin: 4.8 g/dL (ref 3.5–5.0)
Alkaline Phosphatase: 71 U/L (ref 38–126)
Anion gap: 13 (ref 5–15)
BUN: <5 mg/dL — ABNORMAL LOW (ref 6–20)
CO2: 22 mmol/L (ref 22–32)
Calcium: 9.3 mg/dL (ref 8.9–10.3)
Chloride: 100 mmol/L (ref 98–111)
Creatinine, Ser: 1.04 mg/dL — ABNORMAL HIGH (ref 0.44–1.00)
GFR, Estimated: >60 mL/min (ref >60)
Glucose, Bld: 118 mg/dL — ABNORMAL HIGH (ref 70–99)
Potassium: 3 mmol/L — ABNORMAL LOW (ref 3.5–5.1)
Sodium: 135 mmol/L (ref 135–145)
Total Bilirubin: 0.5 mg/dL (ref 0.3–1.2)
Total Protein: 8 g/dL (ref 6.5–8.1)

## 2022-08-30 LAB — URINALYSIS, MICROSCOPIC (REFLEX)

## 2022-08-30 LAB — LIPASE, BLOOD: Lipase: 30 U/L (ref 11–51)

## 2022-08-30 MED ORDER — MORPHINE SULFATE (PF) 4 MG/ML IV SOLN
4.0000 mg | Freq: Once | INTRAVENOUS | Status: AC
  Administered 2022-08-30: 4 mg via INTRAVENOUS
  Filled 2022-08-30: qty 1

## 2022-08-30 MED ORDER — CEPHALEXIN 500 MG PO CAPS: 500.0000 mg | ORAL_CAPSULE | Freq: Four times a day (QID) | ORAL | 0 refills | Status: AC

## 2022-08-30 MED ORDER — KETOROLAC TROMETHAMINE 15 MG/ML IJ SOLN
15.0000 mg | Freq: Once | INTRAMUSCULAR | Status: AC
  Administered 2022-08-30: 15 mg via INTRAVENOUS
  Filled 2022-08-30: qty 1

## 2022-08-30 MED ORDER — POTASSIUM CHLORIDE CRYS ER 20 MEQ PO TBCR: 20.0000 meq | EXTENDED_RELEASE_TABLET | Freq: Every day | ORAL | 0 refills | Status: AC

## 2022-08-30 MED ORDER — POTASSIUM CHLORIDE CRYS ER 20 MEQ PO TBCR
40.0000 meq | EXTENDED_RELEASE_TABLET | Freq: Once | ORAL | Status: AC
  Administered 2022-08-30: 40 meq via ORAL
  Filled 2022-08-30: qty 2

## 2022-08-30 MED ORDER — SODIUM CHLORIDE 0.9 % IV BOLUS
1000.0000 mL | Freq: Once | INTRAVENOUS | Status: AC
  Administered 2022-08-30: 1000 mL via INTRAVENOUS

## 2022-08-30 MED ORDER — HYDROXYZINE HCL 25 MG PO TABS: 25.0000 mg | ORAL_TABLET | Freq: Four times a day (QID) | ORAL | 0 refills | Status: AC | PRN

## 2022-08-30 MED ORDER — SODIUM CHLORIDE 0.9 % IV SOLN
1.0000 g | Freq: Once | INTRAVENOUS | Status: AC
  Administered 2022-08-30: 1 g via INTRAVENOUS
  Filled 2022-08-30: qty 10

## 2022-08-30 MED ORDER — ALUM & MAG HYDROXIDE-SIMETH 200-200-20 MG/5ML PO SUSP
30.0000 mL | Freq: Once | ORAL | Status: AC
  Administered 2022-08-30: 30 mL via ORAL
  Filled 2022-08-30: qty 30

## 2022-08-30 MED ORDER — ONDANSETRON HCL 4 MG/2ML IJ SOLN
4.0000 mg | Freq: Once | INTRAMUSCULAR | Status: AC
  Administered 2022-08-30: 4 mg via INTRAVENOUS
  Filled 2022-08-30: qty 2

## 2022-08-30 NOTE — ED Provider Notes (Signed)
[place] [facility] Provider Note   CSN: [id number] Arrival date & time: 08/30/22  1141     History  Chief Complaint  Patient presents with   Multiple Complaints     Carol Simpson is a 27 y.o. female.  The history is provided by the patient and medical records. No language interpreter was used.     27 year old female significant history of POTS, recently had a cholecystectomy on 08/17/2022 presenting to ED today with multiple complaints.  Patient report she felt some relief after procedure but 3 days later she developed recurrent crampy abdominal pain.  She also endorsed having chills, nausea, vomiting, loose stools, decreased urination, and overall not feeling well.  Pain localized to her right upper quadrant, radiates towards her back.  She was seen in the ED 3 days ago for same and was provided with supportive care after having a repeat abdominal pelvis CT scan that showed some mild enteritis.  Her symptoms still persist.  Home Medications Prior to Admission medications   Medication Sig Start Date End Date Taking? Authorizing Provider  ondansetron (ZOFRAN-ODT) 8 MG disintegrating tablet Take 1 tablet (8 mg total) by mouth every 8 (eight) hours as needed for nausea or vomiting. 08/28/22   Pricilla LovelessGoldston, Scott, MD  oxyCODONE-acetaminophen (PERCOCET/ROXICET) 5-325 MG tablet Take 1 tablet by mouth every 6 (six) hours as needed for severe pain. 08/28/22   Pricilla LovelessGoldston, Scott, MD      Allergies    Patient has no known allergies.    Review of Systems   Review of Systems  All other systems reviewed and are negative.   Physical Exam Updated Vital Signs BP (!) 145/76 (BP Location: Right Arm)   Pulse 88   Temp 98.4 F (36.9 C) (Oral)   Resp 20   Ht 5\' 3"  (1.6 m)   Wt 96.6 kg   LMP 07/28/2022   SpO2 98%   BMI 37.72 kg/m  Physical Exam Vitals and nursing note reviewed.  Constitutional:      General: She is not in acute distress.    Appearance: She is  well-developed. She is obese.  HENT:     Head: Atraumatic.  Eyes:     Conjunctiva/sclera: Conjunctivae normal.  Cardiovascular:     Rate and Rhythm: Normal rate and regular rhythm.     Pulses: Normal pulses.     Heart sounds: Normal heart sounds.  Pulmonary:     Effort: Pulmonary effort is normal.     Breath sounds: No wheezing, rhonchi or rales.  Abdominal:     Palpations: Abdomen is soft.     Tenderness: There is abdominal tenderness. There is no guarding or rebound.  Musculoskeletal:     Cervical back: Neck supple.  Skin:    Findings: No rash.  Neurological:     Mental Status: She is alert.  Psychiatric:        Mood and Affect: Mood normal.     ED Results / Procedures / Treatments   Labs (all labs ordered are listed, but only abnormal results are displayed) Labs Reviewed  CBC WITH DIFFERENTIAL/PLATELET - Abnormal; Notable for the following components:      Result Value   Platelets 495 (*)    All other components within normal limits  COMPREHENSIVE METABOLIC PANEL - Abnormal; Notable for the following components:   Potassium 3.0 (*)    Glucose, Bld 118 (*)    BUN <5 (*)    Creatinine, Ser 1.04 (*)  All other components within normal limits  URINALYSIS, ROUTINE W REFLEX MICROSCOPIC - Abnormal; Notable for the following components:   APPearance CLOUDY (*)    Hgb urine dipstick SMALL (*)    Nitrite POSITIVE (*)    Leukocytes,Ua TRACE (*)    All other components within normal limits  URINALYSIS, MICROSCOPIC (REFLEX) - Abnormal; Notable for the following components:   Bacteria, UA MANY (*)    All other components within normal limits  LIPASE, BLOOD    EKG None  Radiology No results found.  Procedures Procedures    Medications Ordered in ED Medications  ondansetron (ZOFRAN) injection 4 mg (4 mg Intravenous Given 08/30/22 1225)  morphine (PF) 4 MG/ML injection 4 mg (4 mg Intravenous Given 08/30/22 1225)  sodium chloride 0.9 % bolus 1,000 mL (0 mLs  Intravenous Stopped 08/30/22 1329)  cefTRIAXone (ROCEPHIN) 1 g in sodium chloride 0.9 % 100 mL IVPB (0 g Intravenous Stopped 08/30/22 1400)  ketorolac (TORADOL) 15 MG/ML injection 15 mg (15 mg Intravenous Given 08/30/22 1327)  potassium chloride SA (KLOR-CON M) CR tablet 40 mEq (40 mEq Oral Given 08/30/22 1335)  alum & mag hydroxide-simeth (MAALOX/MYLANTA) 200-200-20 MG/5ML suspension 30 mL (30 mLs Oral Given 08/30/22 1421)    ED Course/ Medical Decision Making/ A&P                             Medical Decision Making Amount and/or Complexity of Data Reviewed Labs: ordered.  Risk Prescription drug management.   BP (!) 145/76 (BP Location: Right Arm)   Pulse 88   Temp 98.4 F (36.9 C) (Oral)   Resp 20   Ht 5\' 3"  (1.6 m)   Wt 96.6 kg   LMP 07/28/2022   SpO2 98%   BMI 37.72 kg/m   42:41 PM  27 year old female significant history of POTS, recently had a cholecystectomy on 08/17/2022 presenting to ED today with multiple complaints.  Patient report she felt some relief after procedure but 3 days later she developed recurrent crampy abdominal pain.  She also endorsed having chills, nausea, vomiting, loose stools, decreased urination, and overall not feeling well.  Pain localized to her right upper quadrant, radiates towards her back.  She was seen in the ED 3 days ago for same and was provided with supportive care after having a repeat abdominal pelvis CT scan that showed some mild enteritis.  Her symptoms still persist.  Does endorse increased urinary frequency and urgency but denies any burning on urination.  On exam this is an obese female laying in bed nontoxic in appearance.  Heart with normal rate and rhythm, lungs clear to auscultation bilaterally abdomen is soft but diffusely tender.  She has several laparoscopic incision site without signs of infection.  She has a small bruising noted to her right lower quadrant.  EMR review patient was seen several days prior for similar presentation.  A  CT scan of her abdomen pelvis was performed at that time which shows evidence of mild enteritis but otherwise no acute finding.  Due to recurrent pain post laparoscopic cholecystectomy, I will obtain abdominal pelvis MRI scan to assess for potential retained dropped gallstones that can potentially cause discomfort.  12:39 PM Upon further questioning it appears patient has had this similar discomfort prior to her laparoscopic cholecystectomy which makes a dropped gallstone less likely.  She also mention she recently was removed from her lithium medication as well as a Xanax and has been  without it for the past week.  She feels shaky and tremulous which I felt is likely due to medication withdrawals.  -Labs ordered, independently viewed and interpreted by me.  Labs remarkable for K+ 3.0 will give supplementation.  Cr 1.04, IVF given. UA showing nitrite positive and many bacteria suggestive of UTI.  ROcephin given. Normal WBC, normal lipase and normal LFT, doubt complication from post cholecystectomy -The patient was maintained on a cardiac monitor.  I personally viewed and interpreted the cardiac monitored which showed an underlying rhythm of: NSR -Imaging including abd/pelvis MRI considered to r/o dropped gallstone but did not perform as suspicion is low -This patient presents to the ED for concern of abd pain, increase urination, this involves an extensive number of treatment options, and is a complaint that carries with it a high risk of complications and morbidity.  The differential diagnosis includes post cholecystectomy complication, colitis, GERD, gastritis, pancreatitis, UTI, pyelonephritis, kidney stone -Co morbidities that complicate the patient evaluation includes gallbladder disease, bipolar, POTS -Treatment includes toradol, morphine, IVF, rocephin, zofran -Reevaluation of the patient after these medicines showed that the patient improved -PCP office notes or outside notes  reviewed -Escalation to admission/observation considered: patients feels much better, is comfortable with discharge, and will follow up with PCP -Prescription medication considered, patient comfortable with potassium supplementation, antiemetic, and keflex abx for UTI -Social Determinant of Health considered which includes POTS, bipolar  2:43 PM Patient was given Rocephin for her UTI.  Will discharge home with Keflex.  Will also provide potassium supplementation, and antiemetic.  Since patient reports she has had recurrent urinary tract infection multiple times in a year, I will give patient referral to urology for outpatient evaluation.  Return precaution given.         Final Clinical Impression(s) / ED Diagnoses Final diagnoses:  Acute cystitis without hematuria  Epigastric pain  Hypokalemia    Rx / DC Orders ED Discharge Orders          Ordered    potassium chloride SA (KLOR-CON M) 20 MEQ tablet  Daily        08/30/22 1446    cephALEXin (KEFLEX) 500 MG capsule  4 times daily        08/30/22 1446    hydrOXYzine (ATARAX) 25 MG tablet  Every 6 hours PRN        08/30/22 1446              Fayrene Helperran, Sai Moura, PA-C 08/30/22 1447    Pricilla LovelessGoldston, Scott, MD 09/06/22 1713

## 2022-08-30 NOTE — ED Notes (Signed)
Pt asking for prozac states was given it by her pschy dr and [facility] just picked up rx on Sat

## 2022-08-30 NOTE — ED Triage Notes (Signed)
Pt states continued concern with abdominal pain RUQ , heartburn, red/sweaty feet, Kidney pain and N/V that is returning since 3/26 Had Cholecystectomy on 3/26 Was taken off of lithium and started lamictal on 4/2

## 2022-08-30 NOTE — Discharge Instructions (Addendum)
You have been evaluated for your symptoms.  It appears you have a urinary tract infection that would benefit from taking antibiotic.  Please take hydroxyzine as needed for nausea or for anxiety.  Your potassium level is low today, take supplementation and follow-up closely with your primary care provider for further care.  Due to your recurrent urinary tract infection, follow-up with urologist outpatient for further assessment.  If you do not have a primary care provider, please call number below to find one.
